# Patient Record
Sex: Female | Born: 2005 | Race: Black or African American | Hispanic: No | Marital: Single
Health system: Southern US, Community
[De-identification: ages and names within clinical notes are randomized; demographics above are authoritative.]

## PROBLEM LIST (undated history)

## (undated) ENCOUNTER — Ambulatory Visit

---
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
## 2024-08-06 ENCOUNTER — Ambulatory Visit
